# Patient Record
Sex: Female | Born: 1970 | Hispanic: Yes | Marital: Single | Smoking: Never smoker
Health system: Southern US, Community
[De-identification: ages and names within clinical notes are randomized; demographics above are authoritative.]

## PROBLEM LIST (undated history)

## (undated) DIAGNOSIS — E119 Type 2 diabetes mellitus without complications: Secondary | ICD-10-CM

---

## 2019-09-26 ENCOUNTER — Other Ambulatory Visit: Payer: Self-pay

## 2019-09-26 ENCOUNTER — Emergency Department
Admission: EM | Admit: 2019-09-26 | Discharge: 2019-09-26 | Disposition: A | Discharge status: Home / Self Care | Payer: No Typology Code available for payment source | Attending: Emergency Medicine | Admitting: Emergency Medicine

## 2019-09-26 ENCOUNTER — Emergency Department: Payer: No Typology Code available for payment source

## 2019-09-26 DIAGNOSIS — Y9389 Activity, other specified: Secondary | ICD-10-CM | POA: Diagnosis not present

## 2019-09-26 DIAGNOSIS — Y998 Other external cause status: Secondary | ICD-10-CM | POA: Diagnosis not present

## 2019-09-26 DIAGNOSIS — E119 Type 2 diabetes mellitus without complications: Secondary | ICD-10-CM | POA: Insufficient documentation

## 2019-09-26 DIAGNOSIS — W319XXA Contact with unspecified machinery, initial encounter: Secondary | ICD-10-CM | POA: Insufficient documentation

## 2019-09-26 DIAGNOSIS — S52101A Unspecified fracture of upper end of right radius, initial encounter for closed fracture: Secondary | ICD-10-CM | POA: Insufficient documentation

## 2019-09-26 DIAGNOSIS — S4991XA Unspecified injury of right shoulder and upper arm, initial encounter: Secondary | ICD-10-CM | POA: Diagnosis present

## 2019-09-26 DIAGNOSIS — Y9263 Factory as the place of occurrence of the external cause: Secondary | ICD-10-CM | POA: Insufficient documentation

## 2019-09-26 HISTORY — DX: Type 2 diabetes mellitus without complications: E11.9

## 2019-09-26 MED ORDER — MELOXICAM 15 MG PO TABS: 15.0000 mg | ORAL_TABLET | Freq: Every day | ORAL | 2 refills | Status: AC

## 2019-09-26 MED ORDER — IBUPROFEN 800 MG PO TABS
800.0000 mg | ORAL_TABLET | Freq: Once | ORAL | Status: AC
  Administered 2019-09-26: 800 mg via ORAL
  Filled 2019-09-26: qty 1

## 2019-09-26 NOTE — ED Notes (Signed)
WC Urine collected by Dorian.

## 2019-09-26 NOTE — ED Provider Notes (Signed)
[facility] Emergency Department Provider Note  ____________________________________________   First MD Initiated Contact with Patient 09/26/19 1009     (approximate)  I have reviewed the triage vital signs and the nursing notes.   HISTORY  Chief Complaint Arm Injury    HPI Angelica Mooney is a 49 y.o. 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  Continues to have right arm pain.  No numbness or tingling.  No other complaints.    Past Medical History:  Diagnosis Date  . Diabetes mellitus without complication (HCC)     There are no problems to display for this patient.   History reviewed. No pertinent surgical history.  Prior to Admission medications   Medication Sig Start Date End Date Taking? Authorizing Provider  meloxicam (MOBIC) 15 MG tablet Take 1 tablet (15 mg total) by mouth daily. 09/26/19 09/25/20  Faythe Ghee, PA-C    Allergies Patient has no known allergies.  History reviewed. No pertinent family history.  Social History Social History   Tobacco Use  . Smoking status: Never Smoker  . Smokeless tobacco: Never Used  Substance Use Topics  . Alcohol use: Yes  . Drug use: Not Currently    Review of Systems  Constitutional: No fever/chills Eyes: No visual changes. ENT: No sore throat. Respiratory: Denies cough Genitourinary: Negative for dysuria. Musculoskeletal: Negative for back pain.  Right forearm pain Skin: Negative for rash. Psychiatric: no mood changes,     ____________________________________________   PHYSICAL EXAM:  VITAL SIGNS: ED Triage Vitals  Enc Vitals Group     BP 09/26/19 0958 (!) 151/96     Pulse Rate 09/26/19 0958 87     Resp 09/26/19 0958 17     Temp 09/26/19 0958 99 F (37.2 C)     Temp Source 09/26/19 0958 Oral     SpO2 09/26/19 0958 96 %     Weight  09/26/19 0959 152 lb (68.9 kg)     Height 09/26/19 0959 5' (1.524 m)     Head Circumference --      Peak Flow --      Pain Score 09/26/19 0958 7     Pain Loc --      Pain Edu? --      Excl. in GC? --     Constitutional: Alert and oriented. Well appearing and in no acute distress. Eyes: Conjunctivae are normal.  Head: Atraumatic. Nose: No congestion/rhinnorhea. Mouth/Throat: Mucous membranes are moist.   Neck:  supple no lymphadenopathy noted Cardiovascular: Normal rate, regular rhythm. Respiratory: Normal respiratory effort.  No retractions,  GU: deferred Musculoskeletal: FROM all extremities, warm and well perfused, forearm is tender from the proximal to mid shaft, bruising is noted, neurovascular is intact Neurologic:  Normal speech and language.  Skin:  Skin is warm, dry and intact. No rash noted. Psychiatric: Mood and affect are normal. Speech and behavior are normal.  ____________________________________________   LABS (all labs ordered are listed, but only abnormal results are displayed)  Labs Reviewed - No data to display ____________________________________________   ____________________________________________  RADIOLOGY  X-ray of the right forearm shows a fracture along the radius  ____________________________________________   PROCEDURES  Procedure(s) performed:   .Splint Application  Date/Time: 09/26/2019 11:21 AM Performed by: Candis Musa, NT Authorized by: Faythe Ghee, PA-C   Consent:    Consent  obtained:  Verbal   Consent given by:  Patient   Risks discussed:  Discoloration, numbness, pain and swelling Pre-procedure details:    Sensation:  Normal Procedure details:    Laterality:  Right   Location:  Arm   Arm:  R lower arm   Splint type:  Sugar tong   Supplies:  Ortho-Glass Post-procedure details:    Pain:  Unchanged   Sensation:  Normal   Patient tolerance of procedure:  Tolerated well, no immediate  complications      ____________________________________________   INITIAL IMPRESSION / ASSESSMENT AND PLAN / ED COURSE  Pertinent labs & imaging results that were available during my care of the patient were reviewed by me and considered in my medical decision making (see chart for details).   Patient is a 49 year old female presents emergency department Worker's Comp. injury for right forearm.  Physical exam shows her right forearm the tender along the proximal to mid shaft area bruising is noted  X-ray of the right forearm shows a radial fracture.  Did discuss findings with patient via the [facility] interpreter.  Patient was placed in a sugar tong OCL and sling.  She is to follow-up with a orthopedic of Worker's Comp. choice.  Did explain this in detail to her.  I advised her that due to the language barrier she may want her supervisor to make the appointment for her.  She is to apply ice.  She was given a prescription for meloxicam.  She was discharged stable condition. Worker's Comp. limitations include no use of the right arm until evaluated by orthopedics.      As part of my medical decision making, I reviewed the following data within the electronic MEDICAL RECORD NUMBER Nursing notes reviewed and incorporated, Old chart reviewed, Radiograph reviewed , Notes from prior ED visits and [place] Controlled Substance Database  ____________________________________________   FINAL CLINICAL IMPRESSION(S) / ED DIAGNOSES  Final diagnoses:  Closed fracture of proximal end of right radius, unspecified fracture morphology, initial encounter      NEW MEDICATIONS STARTED DURING THIS VISIT:  New Prescriptions   MELOXICAM (MOBIC) 15 MG TABLET    Take 1 tablet (15 mg total) by mouth daily.     Note:  This document was prepared using Dragon voice recognition software and may include unintentional dictation errors.    Faythe Ghee, PA-C 09/26/19 1124    Jene Every, MD 09/26/19 1144

## 2019-09-26 NOTE — ED Triage Notes (Signed)
Pt states she was at work and had her arm in a machine and it started and something hit her right FA, some bruising noted.

## 2019-09-26 NOTE — Discharge Instructions (Signed)
Follow-up with the orthopedic of Worker's Comp. choice.  I have given you both phone numbers for the groups in [place].  Have your supervisor make the appointment for you.  You are protected in the splint we have placed on you.  Wear the sling to help support the arm.  You will not be able to use your right arm at work until evaluated by orthopedics.  Take the meloxicam for pain as needed

## 2021-01-10 ENCOUNTER — Other Ambulatory Visit: Payer: Self-pay

## 2021-01-10 ENCOUNTER — Emergency Department: Payer: Self-pay

## 2021-01-10 ENCOUNTER — Emergency Department
Admission: EM | Admit: 2021-01-10 | Discharge: 2021-01-10 | Disposition: A | Discharge status: Home / Self Care | Payer: Worker's Compensation | Attending: Student in an Organized Health Care Education/Training Program | Admitting: Student in an Organized Health Care Education/Training Program

## 2021-01-10 DIAGNOSIS — S0101XA Laceration without foreign body of scalp, initial encounter: Secondary | ICD-10-CM | POA: Insufficient documentation

## 2021-01-10 DIAGNOSIS — E119 Type 2 diabetes mellitus without complications: Secondary | ICD-10-CM | POA: Diagnosis not present

## 2021-01-10 DIAGNOSIS — W208XXA Other cause of strike by thrown, projected or falling object, initial encounter: Secondary | ICD-10-CM | POA: Insufficient documentation

## 2021-01-10 DIAGNOSIS — Y9289 Other specified places as the place of occurrence of the external cause: Secondary | ICD-10-CM | POA: Insufficient documentation

## 2021-01-10 DIAGNOSIS — S0990XA Unspecified injury of head, initial encounter: Secondary | ICD-10-CM | POA: Diagnosis present

## 2021-01-10 DIAGNOSIS — Y9389 Activity, other specified: Secondary | ICD-10-CM | POA: Diagnosis not present

## 2021-01-10 DIAGNOSIS — Z23 Encounter for immunization: Secondary | ICD-10-CM | POA: Diagnosis not present

## 2021-01-10 DIAGNOSIS — Y990 Civilian activity done for income or pay: Secondary | ICD-10-CM | POA: Diagnosis not present

## 2021-01-10 MED ORDER — TETANUS-DIPHTH-ACELL PERTUSSIS 5-2.5-18.5 LF-MCG/0.5 IM SUSY
0.5000 mL | PREFILLED_SYRINGE | Freq: Once | INTRAMUSCULAR | Status: AC
  Administered 2021-01-10: 0.5 mL via INTRAMUSCULAR
  Filled 2021-01-10: qty 0.5

## 2021-01-10 MED ORDER — ACETAMINOPHEN 500 MG PO TABS
1000.0000 mg | ORAL_TABLET | Freq: Once | ORAL | Status: AC
  Administered 2021-01-10: 1000 mg via ORAL
  Filled 2021-01-10: qty 2

## 2021-01-10 MED ORDER — CYCLOBENZAPRINE HCL 5 MG PO TABS: 5.0000 mg | ORAL_TABLET | Freq: Three times a day (TID) | ORAL | 0 refills | Status: AC | PRN

## 2021-01-10 MED ORDER — LIDOCAINE-EPINEPHRINE-TETRACAINE (LET) TOPICAL GEL
3.0000 mL | Freq: Once | TOPICAL | Status: AC
  Administered 2021-01-10: 3 mL via TOPICAL
  Filled 2021-01-10: qty 3

## 2021-01-10 NOTE — ED Triage Notes (Signed)
Pt states that while at work today she had an aluminum door fall onto her head, pt denies loc but states was dizzy for awhile, pt has a lac to the top of her head

## 2021-01-10 NOTE — ED Notes (Signed)
See triage note  states she hit her headache on door at work  small laceration noted to top of head

## 2021-01-10 NOTE — ED Provider Notes (Signed)
[facility] Emergency Department Provider Note    Event Date/Time   First MD Initiated Contact with Patient 01/10/21 1027     (approximate)  I have reviewed the triage vital signs and the nursing notes.   HISTORY  Chief Complaint Head Injury and Laceration    HPI Angelica Mooney is a 50 y.o. female presents to the ER for evaluation of head injury occurred after a aluminum door fell and struck her on the head.  States she was reaching up while at work pull something out from upper shelf.  States that the heavy metal door fell hitting her on the head.  Did not lose consciousness.  Did feel little bit dazed for a few minutes.  No nausea or vomiting no seizure.  She is on any blood thinners.  Unsure as to her last tetanus vaccination.  Has not take anything for pain since the accident.  Denies any neck pain no other injuries.  Past Medical History:  Diagnosis Date   Diabetes mellitus without complication (HCC)    No family history on file. No past surgical history on file. There are no problems to display for this patient.     Prior to Admission medications   Medication Sig Start Date End Date Taking? Authorizing Provider  cyclobenzaprine (FLEXERIL) 5 MG tablet Take 1 tablet (5 mg total) by mouth 3 (three) times daily as needed for muscle spasms. 01/10/21  Yes Willy Eddy, MD  empagliflozin (JARDIANCE) 10 MG TABS tablet Take by mouth daily.   Yes [provider]    Allergies Patient has no known allergies.    Social History Social History   Tobacco Use   Smoking status: Never   Smokeless tobacco: Never  Substance Use Topics   Alcohol use: Yes   Drug use: Not Currently    Review of Systems Patient denies headaches, rhinorrhea, blurry vision, numbness, shortness of breath, chest pain, edema, cough, abdominal pain, nausea, vomiting, diarrhea, dysuria, fevers, rashes or hallucinations unless otherwise stated above in  HPI. ____________________________________________   PHYSICAL EXAM:  VITAL SIGNS: Vitals:   01/10/21 0932  BP: (!) 147/101  Pulse: 85  Resp: 16  Temp: 98.7 F (37.1 C)  SpO2: 99%    Constitutional: Alert and oriented. Well appearing and in no acute distress. Eyes: Conjunctivae are normal.  Head: 1 inch superficial laceration to the top of her head no foreign bodies.  No significant surrounding ecchymosis.  No battle sign no raccoon eyes no hemotympanum. Nose: No congestion/rhinnorhea. Mouth/Throat: Mucous membranes are moist.   Neck: Painless ROM.  Cardiovascular:   Good peripheral circulation. Respiratory: Normal respiratory effort.  No retractions.  Gastrointestinal: Soft and nontender.  Musculoskeletal: No lower extremity tenderness .  No joint effusions. Neurologic:  Normal speech and language. No gross focal neurologic deficits are appreciated.  Skin:  Skin is warm, dry and intact. No rash noted. Psychiatric: Mood and affect are normal. Speech and behavior are normal.  ____________________________________________   LABS (all labs ordered are listed, but only abnormal results are displayed)  No results found for this or any previous visit (from the past 24 hour(s)). ____________________________________________ ____________________________________________  RADIOLOGY I personally reviewed all radiographic images ordered to evaluate for the above acute complaints and reviewed radiology reports and findings.  These findings were personally discussed with the patient.  Please see medical record for radiology report.   ____________________________________________   PROCEDURES  Procedure(s) performed:  Marland KitchenMarland KitchenLaceration Repair  Date/Time: 01/10/2021 11:12 AM Performed by:  Willy Eddy, MD Authorized by: Willy Eddy, MD   Consent:    Consent obtained:  Verbal   Consent given by:  Patient Laceration details:    Location:  Scalp   Scalp location:  Crown    Length (cm):  3   Depth (mm):  2 Exploration:    Contaminated: no   Treatment:    Area cleansed with:  Chlorhexidine   Amount of cleaning:  Standard Skin repair:    Repair method:  Staples   Number of staples:  6 Approximation:    Approximation:  Close Repair type:    Repair type:  Simple    Critical Care performed: no ____________________________________________   INITIAL IMPRESSION / ASSESSMENT AND PLAN / ED COURSE  Pertinent labs & imaging results that were available during my care of the patient were reviewed by me and considered in my medical decision making (see chart for details).   DDX: Laceration, contusion, ecchymosis, concussion, SDH, IPH, SAH   Angelica Mooney is a 50 y.o. who presents to the ED with head injury as described above.  Patient clinically well-appearing nontoxic has laceration as described above.  CT head unnecessary based on [place] head injury rule.  No evidence of other associated injury.  Lac repaired as above.  Tetanus was updated.  Stable and appropriate for outpatient follow up.  The patient was evaluated in Emergency Department today for the symptoms described in the history of present illness. He/she was evaluated in the context of the global COVID-19 pandemic, which necessitated consideration that the patient might be at risk for infection with the SARS-CoV-2 virus that causes COVID-19. Institutional protocols and algorithms that pertain to the evaluation of patients at risk for COVID-19 are in a state of rapid change based on information released by regulatory bodies including the CDC and federal and state organizations. These policies and algorithms were followed during the patient's care in the ED.       ____________________________________________   FINAL CLINICAL IMPRESSION(S) / ED DIAGNOSES  Final diagnoses:  Injury of head, initial encounter  Laceration of scalp, initial encounter      NEW MEDICATIONS STARTED  DURING THIS VISIT:  New Prescriptions   CYCLOBENZAPRINE (FLEXERIL) 5 MG TABLET    Take 1 tablet (5 mg total) by mouth 3 (three) times daily as needed for muscle spasms.     Note:  This document was prepared using Dragon voice recognition software and may include unintentional dictation errors.     Willy Eddy, MD 01/10/21 [phone]

## 2021-02-01 ENCOUNTER — Emergency Department
Admission: EM | Admit: 2021-02-01 | Discharge: 2021-02-01 | Disposition: A | Discharge status: Home / Self Care | Payer: Worker's Compensation | Attending: Emergency Medicine | Admitting: Emergency Medicine

## 2021-02-01 ENCOUNTER — Other Ambulatory Visit: Payer: Self-pay

## 2021-02-01 ENCOUNTER — Emergency Department: Payer: Worker's Compensation

## 2021-02-01 ENCOUNTER — Ambulatory Visit: Payer: Self-pay | Admitting: *Deleted

## 2021-02-01 DIAGNOSIS — M50322 Other cervical disc degeneration at C5-C6 level: Secondary | ICD-10-CM | POA: Diagnosis not present

## 2021-02-01 DIAGNOSIS — M549 Dorsalgia, unspecified: Secondary | ICD-10-CM

## 2021-02-01 DIAGNOSIS — M542 Cervicalgia: Secondary | ICD-10-CM

## 2021-02-01 DIAGNOSIS — Z7984 Long term (current) use of oral hypoglycemic drugs: Secondary | ICD-10-CM | POA: Insufficient documentation

## 2021-02-01 DIAGNOSIS — M546 Pain in thoracic spine: Secondary | ICD-10-CM | POA: Insufficient documentation

## 2021-02-01 DIAGNOSIS — W208XXA Other cause of strike by thrown, projected or falling object, initial encounter: Secondary | ICD-10-CM | POA: Insufficient documentation

## 2021-02-01 DIAGNOSIS — S0990XA Unspecified injury of head, initial encounter: Secondary | ICD-10-CM | POA: Diagnosis present

## 2021-02-01 DIAGNOSIS — Y990 Civilian activity done for income or pay: Secondary | ICD-10-CM | POA: Insufficient documentation

## 2021-02-01 DIAGNOSIS — E119 Type 2 diabetes mellitus without complications: Secondary | ICD-10-CM | POA: Diagnosis not present

## 2021-02-01 DIAGNOSIS — M50321 Other cervical disc degeneration at C4-C5 level: Secondary | ICD-10-CM | POA: Insufficient documentation

## 2021-02-01 MED ORDER — METHOCARBAMOL 500 MG PO TABS: 500.0000 mg | ORAL_TABLET | Freq: Three times a day (TID) | ORAL | 0 refills | Status: AC | PRN

## 2021-02-01 MED ORDER — MELOXICAM 15 MG PO TABS: 15.0000 mg | ORAL_TABLET | Freq: Every day | ORAL | 2 refills | Status: AC

## 2021-02-01 NOTE — Telephone Encounter (Signed)
Patient's son is calling/translating for patient. Patient had work injury to her head 10/24. Patient had to get staples for laceration. Patient states she was diagnosed with concussion- but not in chart. Patient reports headache, neck pain, dizziness, and fatigue since the injury. Patient incision is clean and dry. Patient has no PCP for follow up- advised UC/ED for evaluation of symptoms.

## 2021-02-01 NOTE — ED Provider Notes (Signed)
Emergency Medicine Provider Triage Evaluation Note  Angelica Mooney , a 50 y.o. female  was evaluated in triage.  Pt complains of headache, neck pain and upper back pain after workplace injury.  Patient reports that a door fell on her head several weeks ago and she is still having pain.  She states that she was evaluated when injury occurred but is still uncomfortable.  No chest pain, chest tightness or abdominal pain.  Review of Systems  Positive: Patient has headache, neck pain and upper back pain.  Negative: No chest pain, chest tightness or abdominal pain.   Physical Exam  BP (!) 166/99   Pulse 87   Temp 98.5 F (36.9 C) (Oral)   Resp 20   SpO2 96%  Gen:   Awake, no distress   Resp:  Normal effort  MSK:   Moves extremities without difficulty  Other:    Medical Decision Making  Medically screening exam initiated at 5:55 PM.  Appropriate orders placed.  Otho Ket was informed that the remainder of the evaluation will be completed by another provider, this initial triage assessment does not replace that evaluation, and the importance of remaining in the ED until their evaluation is complete.     Pia Mau [place], PA-C 02/01/21 1756    Chesley Noon, MD 02/01/21 2123

## 2021-02-01 NOTE — ED Triage Notes (Signed)
Triaged with video interpretor [id number]. PT to ED via POV c/o pain in head, neck and back since an accident where a door fell on her head causing a laceration a few weeks ago. Pain is in middle of back. PT is alert and oriented, speech appears clear.  Annice Pih, [place] assessing pt.

## 2021-02-01 NOTE — Telephone Encounter (Signed)
Reason for Disposition  [1] SEVERE headache (e.g., excruciating, pain scale 8-10) AND [2] not improved after pain medications  Answer Assessment - Initial Assessment Questions 1. MECHANISM: "How did the injury happen?" For falls, ask: "What height did you fall from?" and "What surface did you fall against?"      10/24- patient had head injury-at work-cut/staples paced 2. ONSET: "When did the injury happen?" (Minutes or hours ago)      10/24 3. NEUROLOGIC SYMPTOMS: "Was there any loss of consciousness?" "Are there any other neurological symptoms?"      Headache, dizzy at time, fatigue 4. MENTAL STATUS: "Does the person know who they are, who you are, and where they are?"      No confusion 5. LOCATION: "What part of the head was hit?"      Top of head 6. SCALP APPEARANCE: "What does the scalp look like? Is it bleeding now?" If Yes, ask: "Is it difficult to stop?"      No sign of redness, swelling- healed  7. SIZE: For cuts, bruises, or swelling, ask: "How large is it?" (e.g., inches or centimeters)      na 8. PAIN: "Is there any pain?" If Yes, ask: "How bad is it?"  (e.g., Scale 1-10; or mild, moderate, severe)     Headache main pain- back of neck hurts when she lays down 9. TETANUS: For any breaks in the skin, ask: "When was the last tetanus booster?"     na 10. OTHER SYMPTOMS: "Do you have any other symptoms?" (e.g., neck pain, vomiting)       Neck pain- dizziness 11. PREGNANCY: "Is there any chance you are pregnant?" "When was your last menstrual period?"       na  Answer Assessment - Initial Assessment Questions 1. SYMPTOMS: "What symptom are you most concerned about?" (e.g., headache, difficulty sleeping, difficulty concentrating).      Headache, neck pain 2. OTHER SYMPTOMS: "Do you have any other symptoms?" (e.g., feeling foggy, irritable)     fatigued 3. mTBI: "When did your head injury happen?" "How did it occur?"      10/24 4. mTBI DIAGNOSIS:  "Who diagnosed your  concussion?"     ED 5. mTBI TESTING: "Did you have a CT scan or MRI of your head?"     no 6. mTBI LAST VISIT:  "When were you seen for your head injury?"     10/24 7. MEDICATIONS:  "Were you given a prescription for medications?"  If Yes, ask:  "What are they?" "Were any over-the-counter  medications recommended?"     Cyclobenzaprine  8. FOLLOW-UP APPT:  "Do you have a follow-up appointment?"     No PCP 9. ALCOHOL USE OR SUBSTANCE USE (DRUG USE): "Do you drink alcohol or use any illegal drugs?"     no 10. PSYCHOLOGICAL HEALTH DIAGNOSES: "Do you have any other psychological or emotional conditions?" (e.g., anxiety, depression, PTSD)       no  Protocols used: Head Injury-A-AH, Traumatic Brain Injury More than 14 Days Ago Follow-up Call-A-AH

## 2021-02-01 NOTE — Discharge Instructions (Signed)
Take meloxicam and Robaxin as directed. 

## 2021-02-01 NOTE — ED Provider Notes (Signed)
[facility]-EMERGENCY DEPARTMENT  ____________________________________________  Time seen: Approximately 8:55 PM  I have reviewed the triage vital signs and the nursing notes.   HISTORY  Chief Complaint Headache and Neck Pain   Historian    HPI Angelica Mooney is a 50 y.o. female presents to the emergency department with headache, neck pain and upper back pain after door reportedly fell on patient several weeks ago at work.  Patient states that she is still having discomfort and wants to be reevaluated.  No chest pain, chest tightness or abdominal pain.   Past Medical History:  Diagnosis Date   Diabetes mellitus without complication (HCC)      Immunizations up to date:  Yes.     Past Medical History:  Diagnosis Date   Diabetes mellitus without complication (HCC)     There are no problems to display for this patient.   History reviewed. No pertinent surgical history.  Prior to Admission medications   Medication Sig Start Date End Date Taking? Authorizing Provider  meloxicam (MOBIC) 15 MG tablet Take 1 tablet (15 mg total) by mouth daily. 02/01/21 02/01/22 Yes Pia Mau M, PA-C  methocarbamol (ROBAXIN) 500 MG tablet Take 1 tablet (500 mg total) by mouth every 8 (eight) hours as needed for up to 5 days. 02/01/21 02/06/21 Yes Pia Mau M, PA-C  cyclobenzaprine (FLEXERIL) 5 MG tablet Take 1 tablet (5 mg total) by mouth 3 (three) times daily as needed for muscle spasms. 01/10/21   Willy Eddy, MD  empagliflozin (JARDIANCE) 10 MG TABS tablet Take by mouth daily.    [provider]    Allergies Patient has no known allergies.  History reviewed. No pertinent family history.  Social History Social History   Tobacco Use   Smoking status: Never   Smokeless tobacco: Never  Substance Use Topics   Alcohol use: Yes   Drug use: Not Currently     Review of Systems  Constitutional: No fever/chills Eyes:  No discharge ENT: No upper  respiratory complaints. Respiratory: no cough. No SOB/ use of accessory muscles to breath Gastrointestinal:   No nausea, no vomiting.  No diarrhea.  No constipation. Musculoskeletal: Negative for musculoskeletal pain. Neuro: Patient has headache.  Skin: Negative for rash, abrasions, lacerations, ecchymosis.    ____________________________________________   PHYSICAL EXAM:  VITAL SIGNS: ED Triage Vitals  Enc Vitals Group     BP 02/01/21 1750 (!) 166/99     Pulse Rate 02/01/21 1750 87     Resp 02/01/21 1750 20     Temp 02/01/21 1750 98.5 F (36.9 C)     Temp Source 02/01/21 1750 Oral     SpO2 02/01/21 1750 96 %     Weight 02/01/21 1754 145 lb (65.8 kg)     Height 02/01/21 1754 4' 7.12" (1.4 m)     Head Circumference --      Peak Flow --      Pain Score 02/01/21 1754 6     Pain Loc --      Pain Edu? --      Excl. in GC? --      Constitutional: Alert and oriented. Well appearing and in no acute distress. Eyes: Conjunctivae are normal. PERRL. EOMI. Head: Atraumatic. ENT:      Nose: No congestion/rhinnorhea.      Mouth/Throat: Mucous membranes are moist.  Neck: No stridor.  No cervical spine tenderness to palpation. Cardiovascular: Normal rate, regular rhythm. Normal S1 and S2.  Good peripheral circulation. Respiratory: Normal respiratory  effort without tachypnea or retractions. Lungs CTAB. Good air entry to the bases with no decreased or absent breath sounds Gastrointestinal: Bowel sounds x 4 quadrants. Soft and nontender to palpation. No guarding or rigidity. No distention. Musculoskeletal: Full range of motion to all extremities. No obvious deformities noted Neurologic:  Normal for age. No gross focal neurologic deficits are appreciated.  Skin:  Skin is warm, dry and intact. No rash noted. Psychiatric: Mood and affect are normal for age. Speech and behavior are normal.   ____________________________________________   LABS (all labs ordered are listed, but only  abnormal results are displayed)  Labs Reviewed - No data to display ____________________________________________  EKG   ____________________________________________  RADIOLOGY Geraldo Pitter, personally viewed and evaluated these images (plain radiographs) as part of my medical decision making, as well as reviewing the written report by the radiologist.  DG Thoracic Spine 2 View  Result Date: 02/01/2021 CLINICAL DATA:  Status post trauma several weeks ago with subsequent head, neck and back pain. EXAM: THORACIC SPINE 2 VIEWS COMPARISON:  None. FINDINGS: There is no evidence of thoracic spine fracture. Very mild dextroscoliosis of the lower thoracic spine is noted. No other significant bone abnormalities are identified. IMPRESSION: 1. Very mild dextroscoliosis of the lower thoracic spine. 2. No acute osseous abnormality. Electronically Signed   By: Aram Candela M.D.   On: 02/01/2021 18:43   CT Head Wo Contrast  Result Date: 02/01/2021 CLINICAL DATA:  Headache, classic migraine EXAM: CT HEAD WITHOUT CONTRAST TECHNIQUE: Contiguous axial images were obtained from the base of the skull through the vertex without intravenous contrast. COMPARISON:  None. FINDINGS: Brain: No acute intracranial abnormality. Specifically, no hemorrhage, hydrocephalus, mass lesion, acute infarction, or significant intracranial injury. Vascular: No hyperdense vessel or unexpected calcification. Skull: No acute calvarial abnormality. Sinuses/Orbits: No acute findings Other: None IMPRESSION: Normal study. Electronically Signed   By: Charlett Nose M.D.   On: 02/01/2021 18:44   CT Cervical Spine Wo Contrast  Result Date: 02/01/2021 CLINICAL DATA:  Status post trauma several weeks ago with subsequent head, neck and back pain. EXAM: CT CERVICAL SPINE WITHOUT CONTRAST TECHNIQUE: Multidetector CT imaging of the cervical spine was performed without intravenous contrast. Multiplanar CT image reconstructions were also  generated. COMPARISON:  None. FINDINGS: Alignment: Normal. Skull base and vertebrae: No acute fracture. No primary bone lesion or focal pathologic process. Soft tissues and spinal canal: No prevertebral fluid or swelling. No visible canal hematoma. Disc levels: Mild anterior osteophyte formation is seen at the levels of C4-C5 and C5-C6. Very mild intervertebral disc space narrowing is seen at the levels of C4-C5 and C5-C6. Mild, bilateral multilevel facet joint hypertrophy is noted. Upper chest: Negative. Other: None. IMPRESSION: 1. No evidence of an acute fracture or subluxation in the cervical spine. 2. Mild degenerative changes at the levels of C4-C5 and C5-C6. Electronically Signed   By: Aram Candela M.D.   On: 02/01/2021 18:47    ____________________________________________    PROCEDURES  Procedure(s) performed:     Procedures     Medications - No data to display   ____________________________________________   INITIAL IMPRESSION / ASSESSMENT AND PLAN / ED COURSE  Pertinent labs & imaging results that were available during my care of the patient were reviewed by me and considered in my medical decision making (see chart for details).    Assessment and plan Neck pain Upper back pain 50 year old female presents to the emergency department after workplace injury several weeks ago.  Patient  was hypertensive at triage but vital signs were otherwise reassuring.  She was alert, active and nontoxic-appearing.  No evidence of intracranial bleed or skull fracture on CT head.  No cervical spine fracture on CT of the cervical spine.  No fracture of the thoracic spine on thoracic spine x-ray.  Patient was discharged with meloxicam and Robaxin and she was advised to follow-up with primary care      ____________________________________________  FINAL CLINICAL IMPRESSION(S) / ED DIAGNOSES  Final diagnoses:  Neck pain  Upper back pain      NEW MEDICATIONS STARTED DURING  THIS VISIT:  ED Discharge Orders          Ordered    meloxicam (MOBIC) 15 MG tablet  Daily        02/01/21 2050    methocarbamol (ROBAXIN) 500 MG tablet  Every 8 hours PRN        02/01/21 2050                This chart was dictated using voice recognition software/Dragon. Despite best efforts to proofread, errors can occur which can change the meaning. Any change was purely unintentional.     Orvil Feil, PA-C 02/01/21 1610    Chesley Noon, MD 02/01/21 2124

## 2022-01-26 ENCOUNTER — Encounter: Payer: Self-pay | Admitting: Family Medicine

## 2022-10-28 IMAGING — CT CT HEAD W/O CM
4 series · 16 of 47 positions shown, 18 images · non-contrast
Comparison: None.

CLINICAL DATA: Headache, classic migraine

EXAM:
CT HEAD WITHOUT CONTRAST
TECHNIQUE: Contiguous axial images were obtained from the base of the skull
through the vertex without intravenous contrast.

[Series 2: head wo · axial · 0.43mm/px · z∈[-109,-9]mm · 7 of 28 slices shown, 9 images]
[im 4/28  brain]
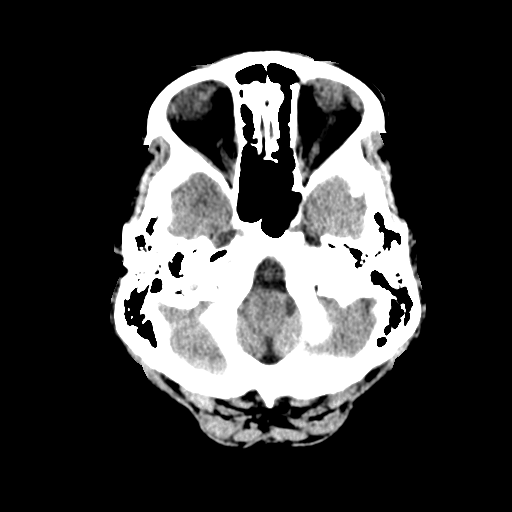
[im 4/28  bone]
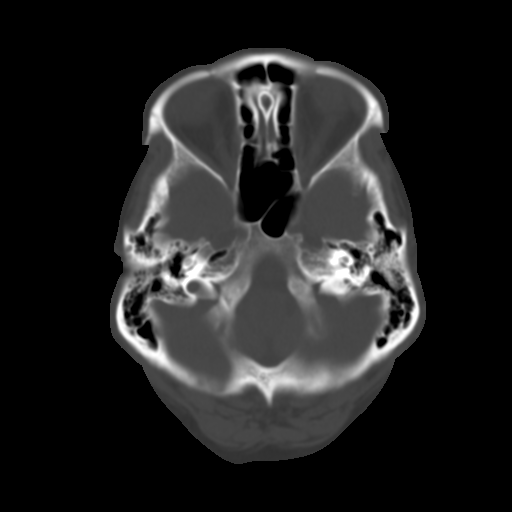
[im 7/28  brain]
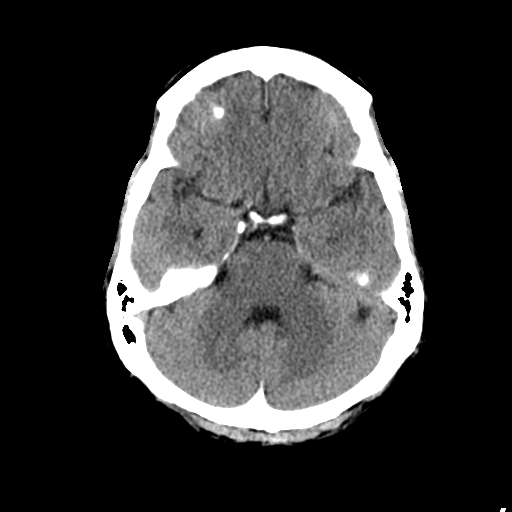
[im 11/28  brain]
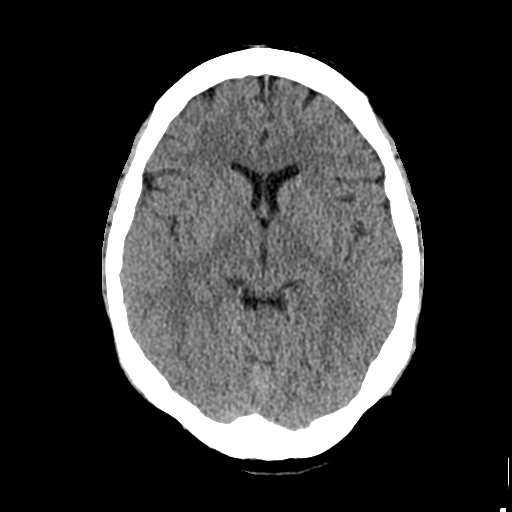
[im 14/28  brain]
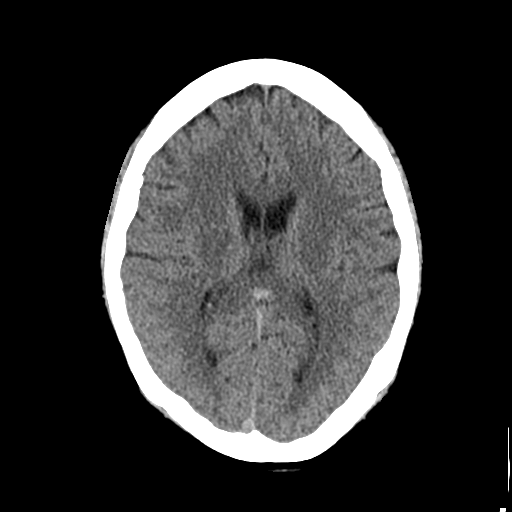
[im 17/28  brain]
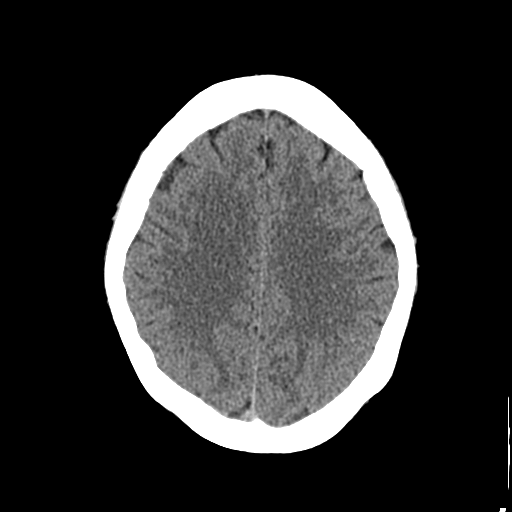
[im 17/28  bone]
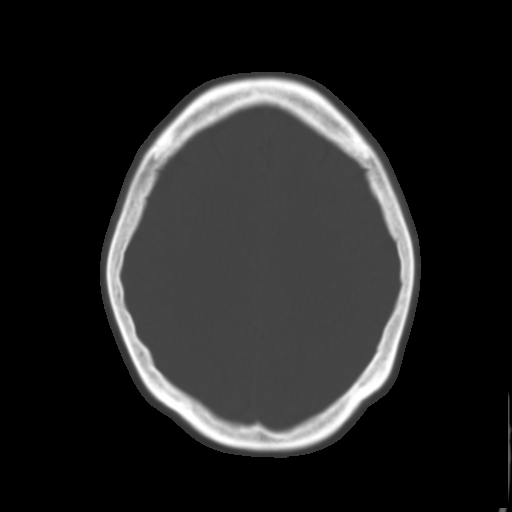
[im 21/28  brain]
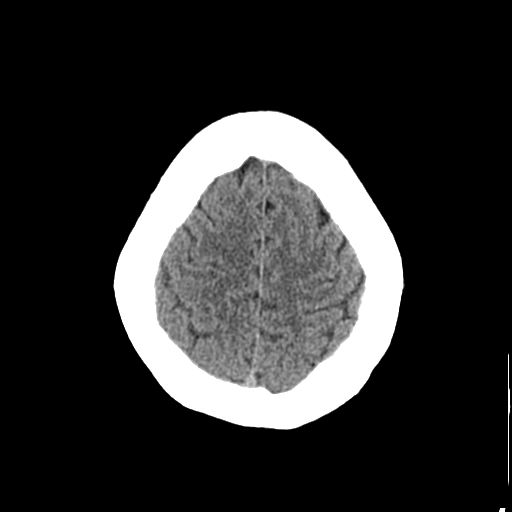
[im 24/28  brain]
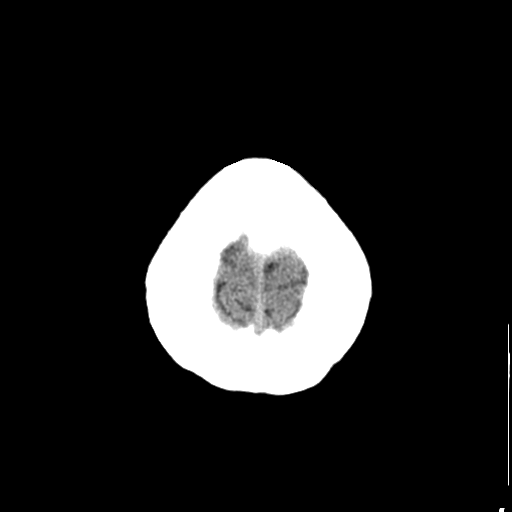

[Series 3: head bone · axial · 0.43mm/px · z∈[-112,-84]mm · 3 of 69 slices shown]
[im 7/69  bone]
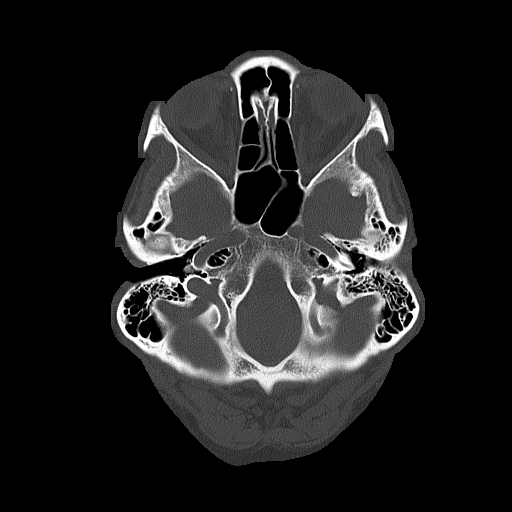
[im 14/69  bone]
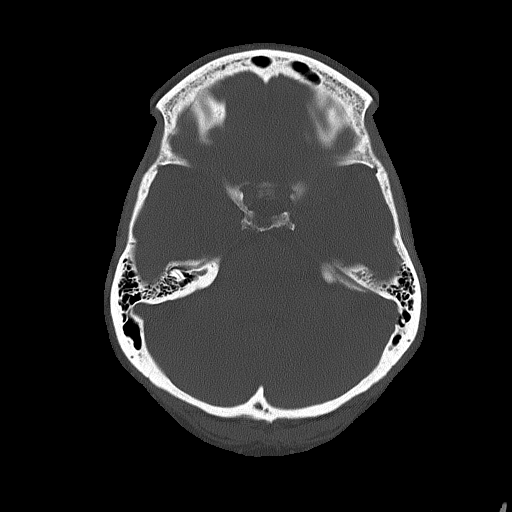
[im 21/69  bone]
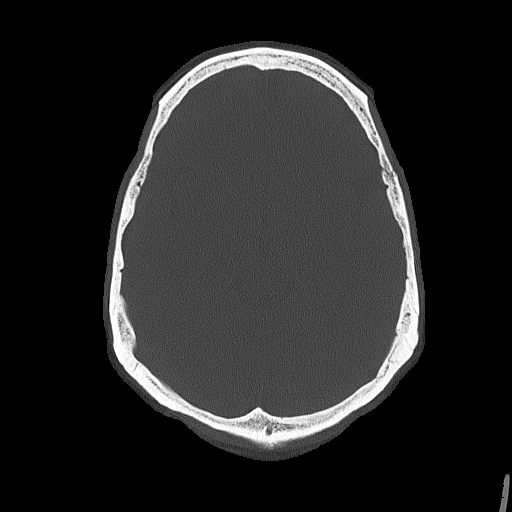

[Series 4: coronal soft tissue · coronal · 0.29mm/px · 3 of 60 slices shown]
[im 20/60  brain]
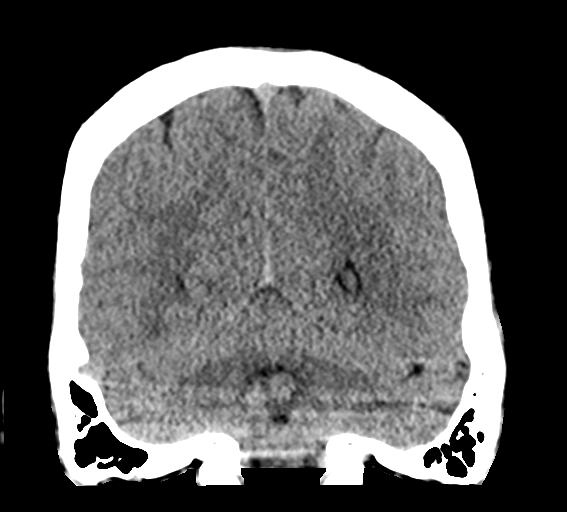
[im 27/60  brain]
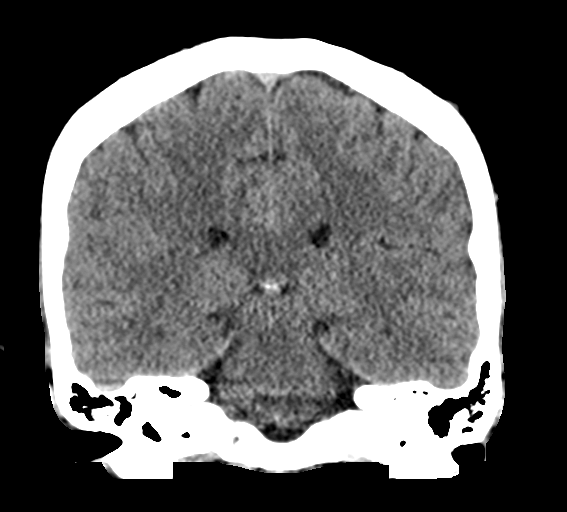
[im 33/60  brain]
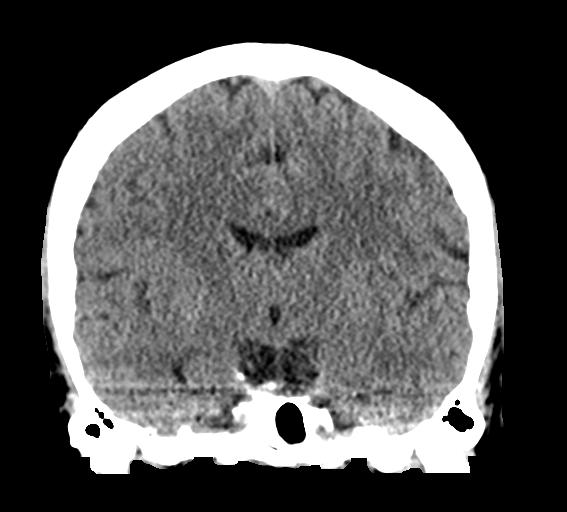

[Series 5: sagittal soft tissue · sagittal · 0.29mm/px · 3 of 49 slices shown]
[im 17/49  brain]
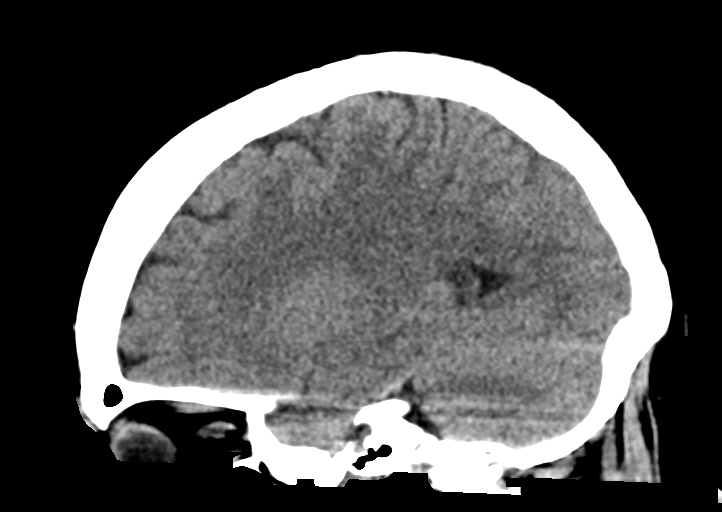
[im 25/49  brain]
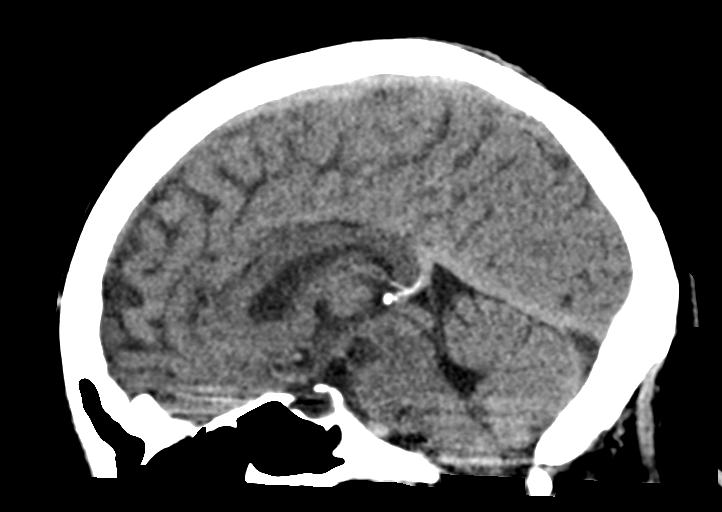
[im 33/49  brain]
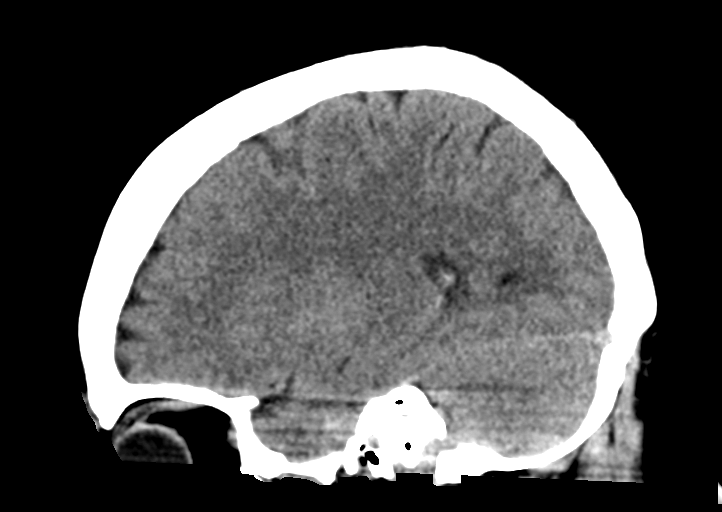

[16 of 47 positions shown; findings below may reference images not displayed]

FINDINGS: Brain: No acute intracranial abnormality. Specifically, no
hemorrhage, hydrocephalus, mass lesion, acute infarction, or
significant intracranial injury.

Vascular: No hyperdense vessel or unexpected calcification.

Skull: No acute calvarial abnormality.

Sinuses/Orbits: No acute findings

Other: None
IMPRESSION: Normal study.

## 2022-10-28 IMAGING — CT CT CERVICAL SPINE W/O CM
3 of 4 series · 12 of 33 positions shown, 14 images · non-contrast
Comparison: None.

CLINICAL DATA: Status post trauma several weeks ago with subsequent
head, neck and back pain.

EXAM:
CT CERVICAL SPINE WITHOUT CONTRAST
TECHNIQUE: Multidetector CT imaging of the cervical spine was performed without
intravenous contrast. Multiplanar CT image reconstructions were also
generated.

[Series 6: sagittal bone · sagittal · 0.27mm/px · 5 of 60 slices shown, 6 images]
[im 20/60  bone]
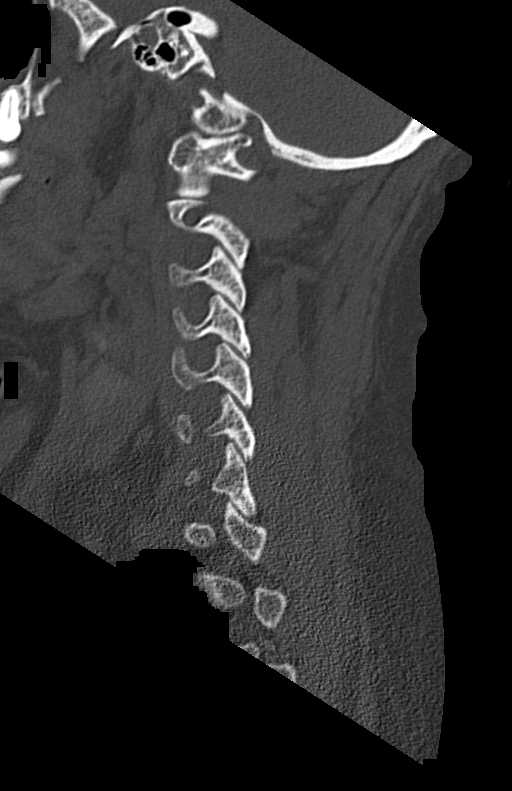
[im 25/60  bone]
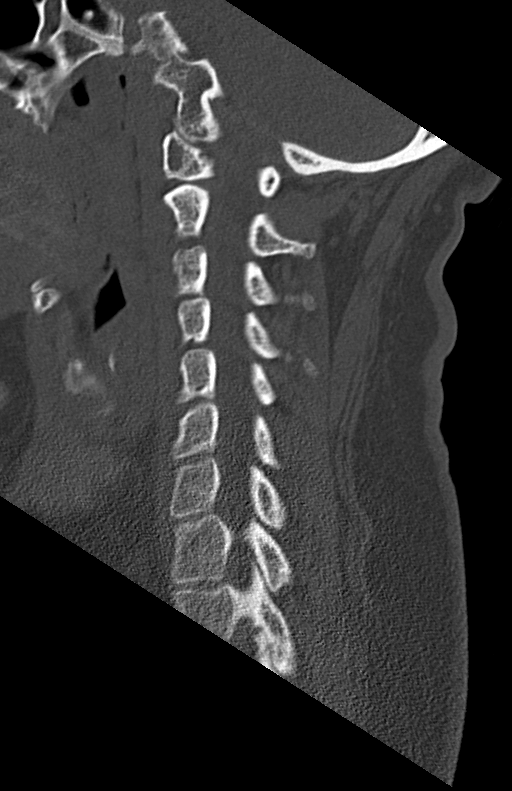
[im 30/60  soft-tissue]
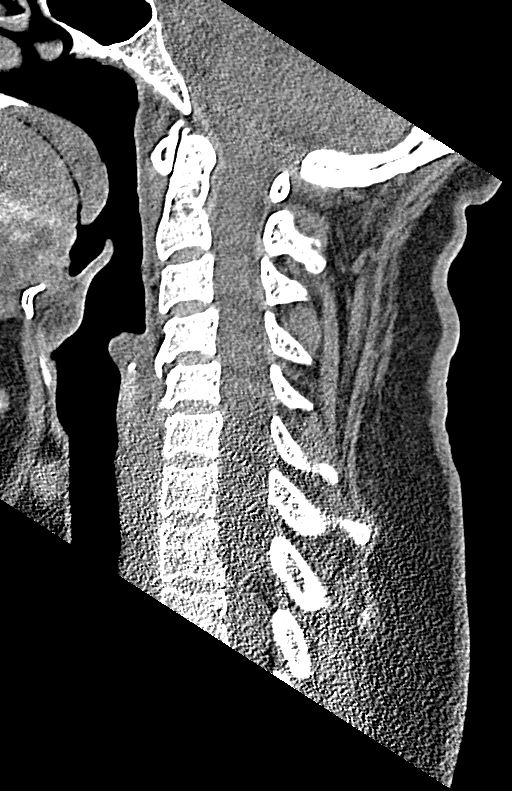
[im 30/60  bone]
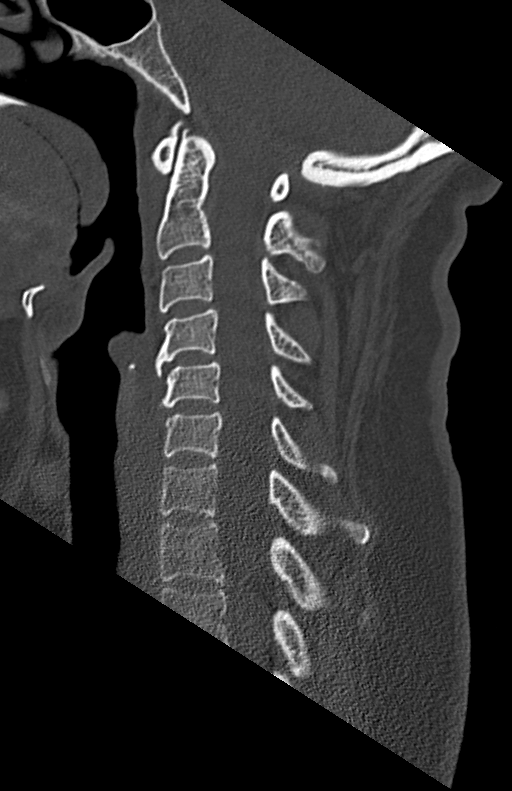
[im 35/60  bone]
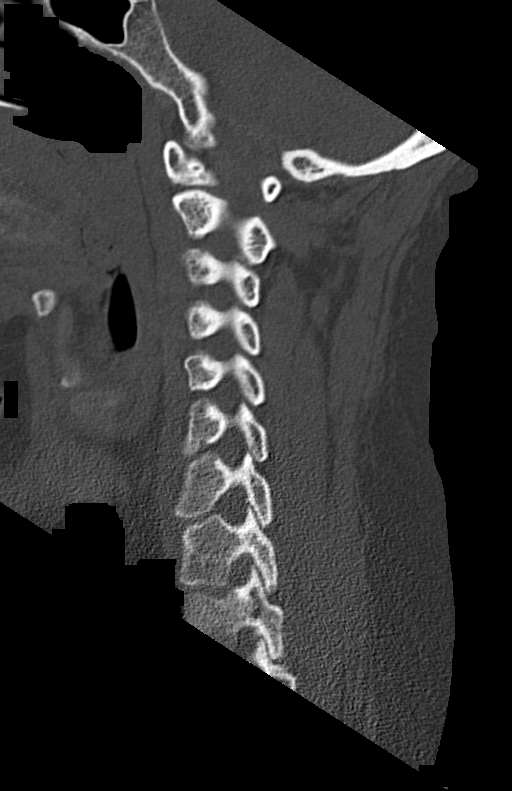
[im 40/60  bone]
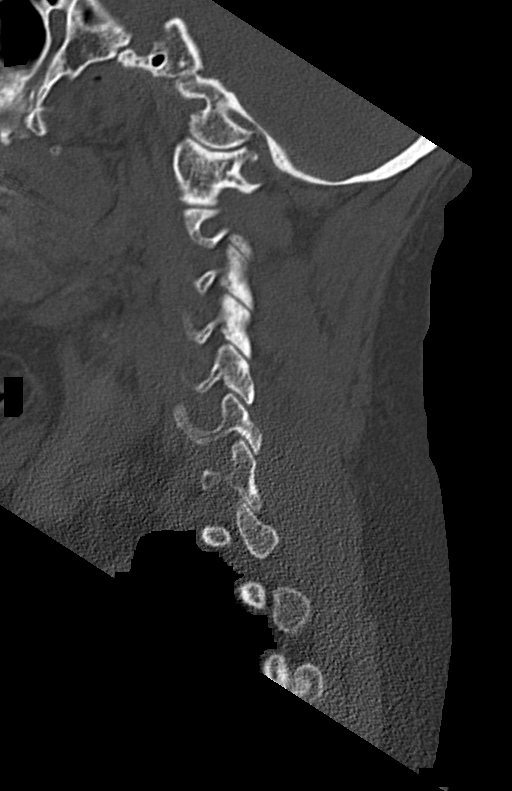

[Series 7: coronal bone · coronal · 0.23mm/px · 3 of 56 slices shown]
[im 14/56  bone]
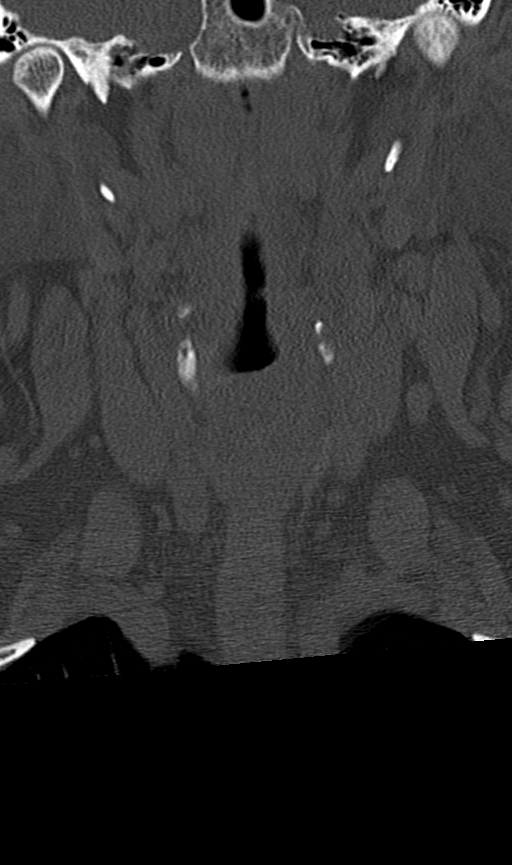
[im 23/56  bone]
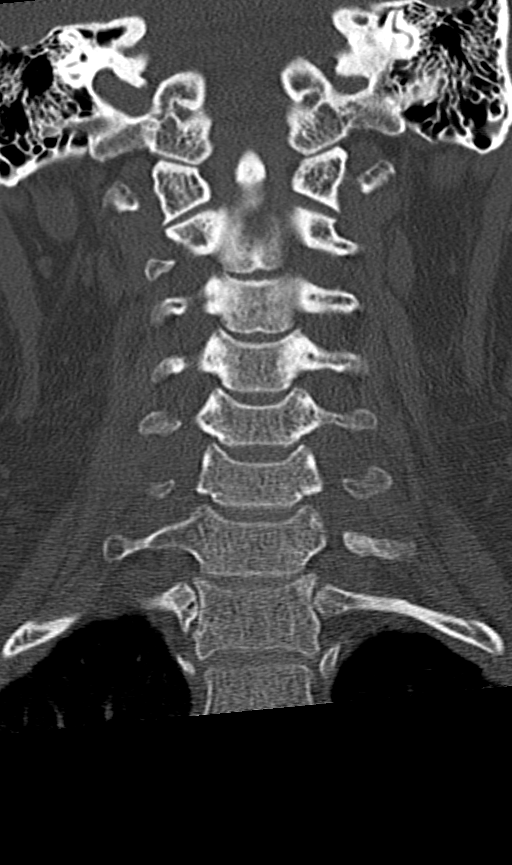
[im 33/56  bone]
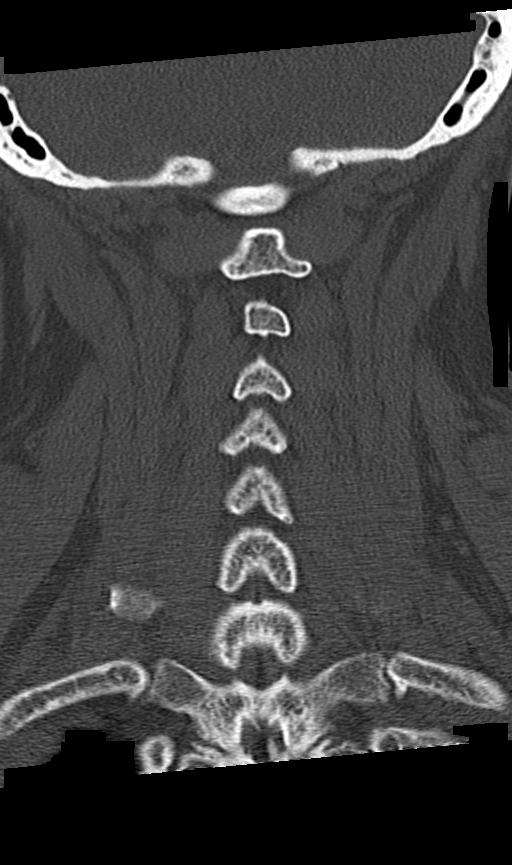

[Series 8: orthogonal bone · axial · 0.24mm/px · z∈[-264,-146]mm · 4 of 101 slices shown, 5 images]
[im 15/101  soft-tissue]
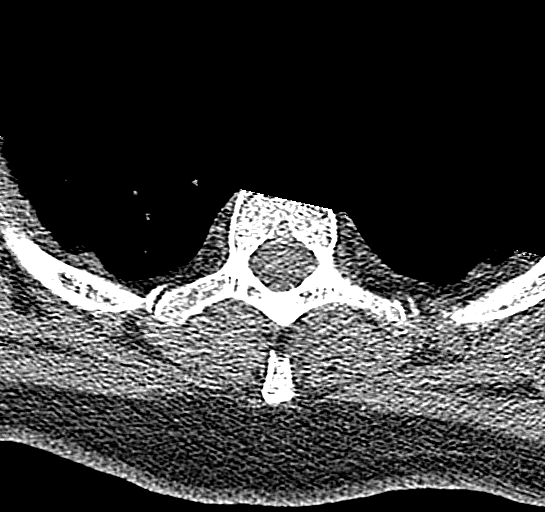
[im 15/101  bone]
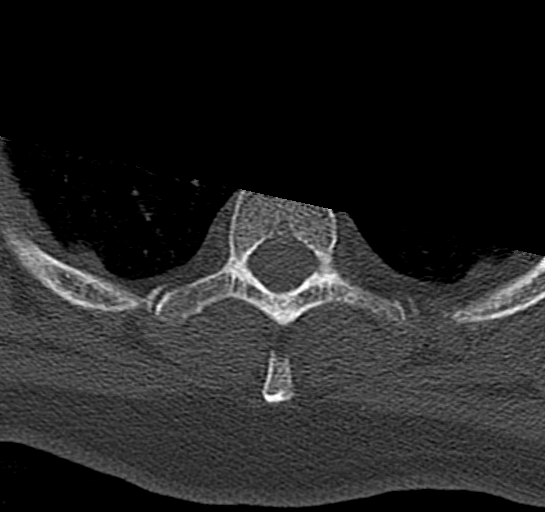
[im 43/101  bone]
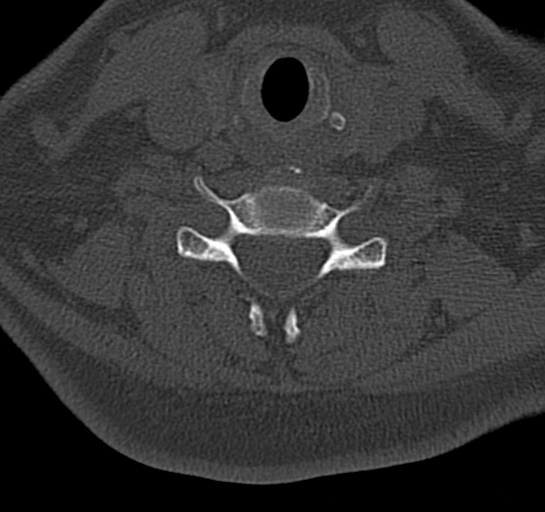
[im 58/101  bone]
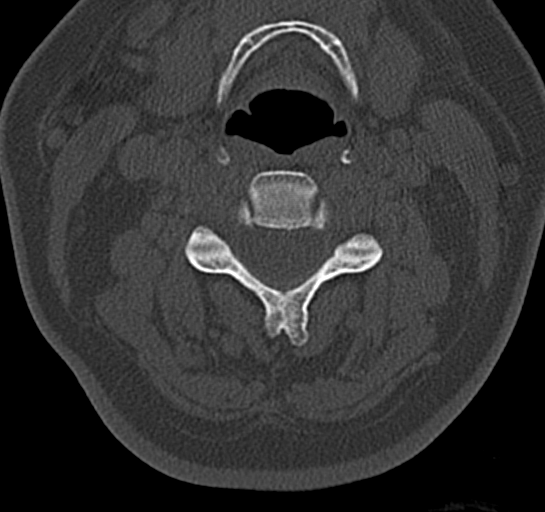
[im 86/101  bone]
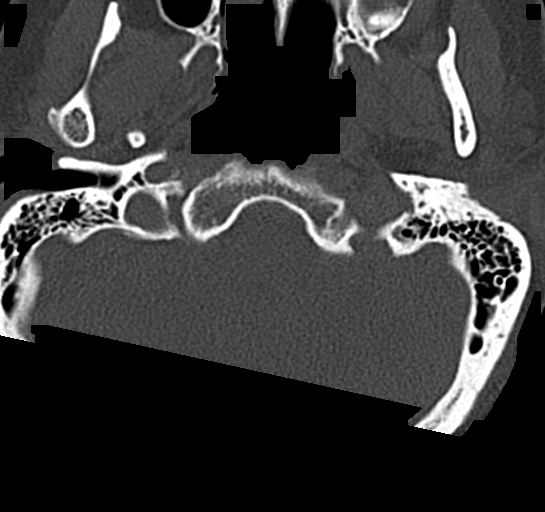

[12 of 33 positions shown; findings below may reference images not displayed]

FINDINGS: Alignment: Normal.

Skull base and vertebrae: No acute fracture. No primary bone lesion
or focal pathologic process.

Soft tissues and spinal canal: No prevertebral fluid or swelling. No
visible canal hematoma.

Disc levels: Mild anterior osteophyte formation is seen at the
levels of C4-C5 and C5-C6.

Very mild intervertebral disc space narrowing is seen at the levels
of C4-C5 and C5-C6.

Mild, bilateral multilevel facet joint hypertrophy is noted.

Upper chest: Negative.

Other: None.
IMPRESSION: 1. No evidence of an acute fracture or subluxation in the cervical
spine.
2. Mild degenerative changes at the levels of C4-C5 and C5-C6.

## 2023-08-29 ENCOUNTER — Encounter: Payer: Self-pay | Admitting: Primary Care
